# Patient Record
Sex: Male | Born: 1960 | Race: White | Hispanic: No | Marital: Married | Smoking: Never smoker
Health system: Southern US, Community
[De-identification: ages and names within clinical notes are randomized; demographics above are authoritative.]

## PROBLEM LIST (undated history)

## (undated) DIAGNOSIS — I82409 Acute embolism and thrombosis of unspecified deep veins of unspecified lower extremity: Secondary | ICD-10-CM

## (undated) DIAGNOSIS — I2699 Other pulmonary embolism without acute cor pulmonale: Secondary | ICD-10-CM

## (undated) DIAGNOSIS — D6851 Activated protein C resistance: Secondary | ICD-10-CM

## (undated) DIAGNOSIS — E119 Type 2 diabetes mellitus without complications: Secondary | ICD-10-CM

## (undated) HISTORY — PX: HERNIA REPAIR: SHX51

## (undated) HISTORY — PX: FINGER AMPUTATION: SHX636

---

## 2006-03-01 ENCOUNTER — Ambulatory Visit: Payer: Self-pay | Admitting: Family Medicine

## 2007-04-17 ENCOUNTER — Ambulatory Visit (HOSPITAL_BASED_OUTPATIENT_CLINIC_OR_DEPARTMENT_OTHER): Admission: RE | Admit: 2007-04-17 | Discharge: 2007-04-17 | Payer: Self-pay | Admitting: Urology

## 2007-06-26 ENCOUNTER — Ambulatory Visit (HOSPITAL_BASED_OUTPATIENT_CLINIC_OR_DEPARTMENT_OTHER): Admission: RE | Admit: 2007-06-26 | Discharge: 2007-06-26 | Payer: Self-pay | Admitting: Orthopaedic Surgery

## 2007-07-27 ENCOUNTER — Encounter: Admission: RE | Admit: 2007-07-27 | Discharge: 2007-07-27 | Payer: Self-pay | Admitting: Orthopaedic Surgery

## 2008-01-20 IMAGING — RF DG DISKOGRAPHY LUMBAR S+I
9 series · 9 of 9 positions shown · non-contrast
Comparison: none

CLINICAL DATA: The pain has pain low back extends to the left of midline.  He was referred for diagnostic injections.
LUMBAR DISKOGRAM:
TECHNIQUE: He received 1 gram of IV Ancef prior to the procedure.  3 cc of Ancef was placed in the contrast vial utilized for the procedure.  Right posterolateral approaches were utilized.  A 22 gauge Chiba needle was directed into the L3-4 and L4-5 disks.  A 22 gauge spinal needle was placed in the right L5-S1 disk in a transthecal fashion.
Contrast was injected utilizing pressure manometry.
TECHNIQUE: Multidetector CT imaging of the lumbar spine was performed after intradiskal injection of contrast.  Multiplanar CT image reconstructions were also generated.

[Series 1: discogram · 1 of 1 slices shown (1 of 6)]
[im 1/1]
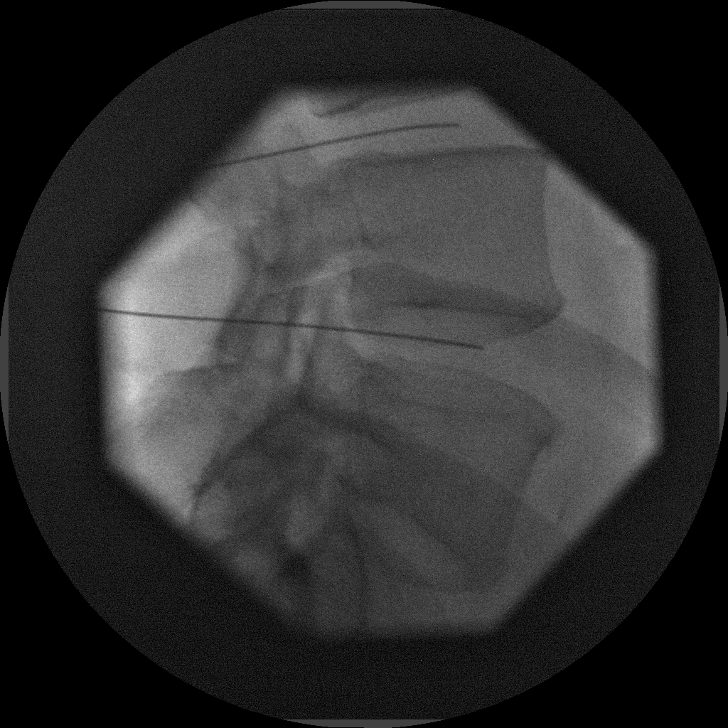

[Series 2: discogram · 1 of 1 slices shown (2 of 6)]
[im 1/1]
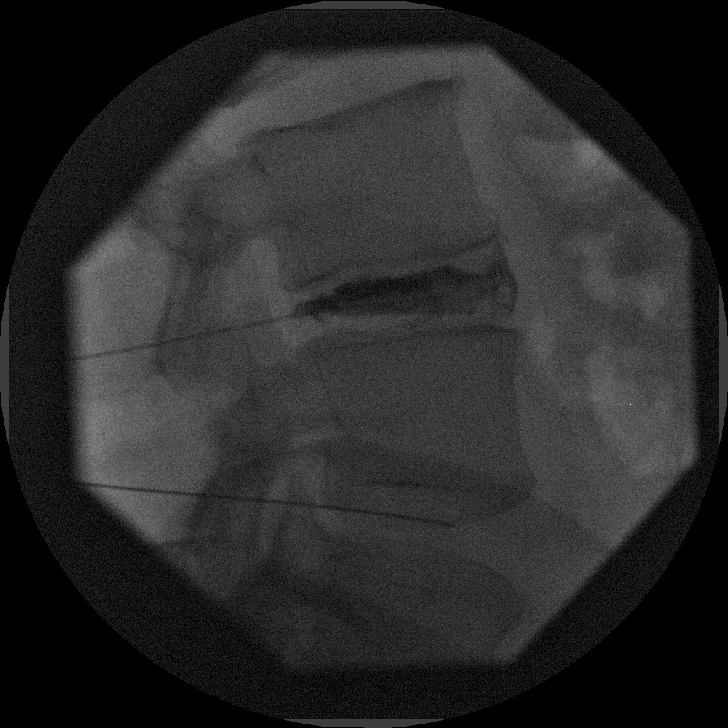

[Series 3: (hospital) · 1 of 1 slices shown (1 of 3)]
[im 1/1]
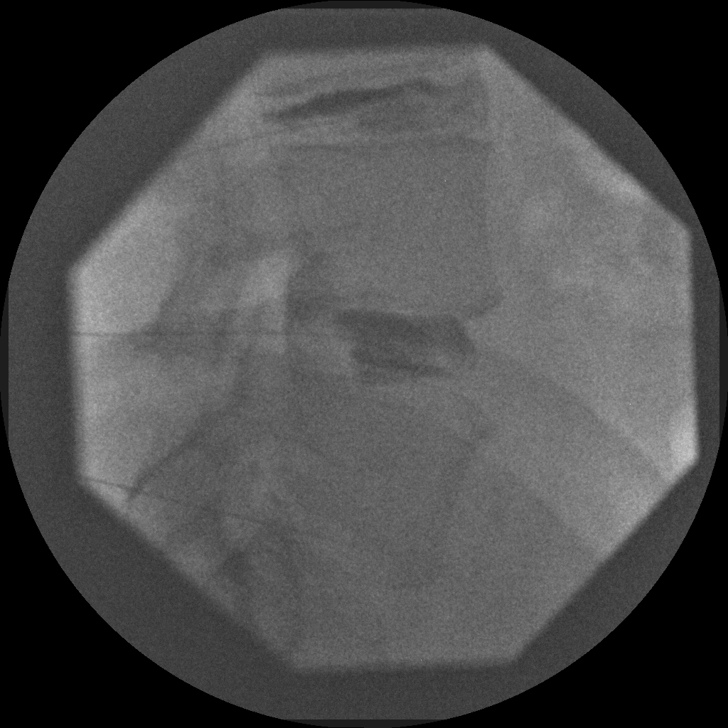

[Series 4: discogram · 1 of 1 slices shown (3 of 6)]
[im 1/1]
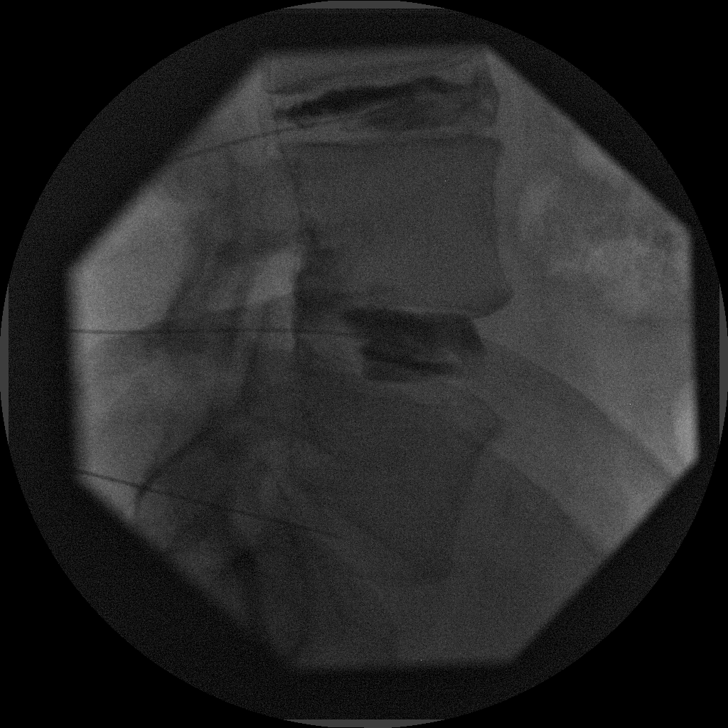

[Series 5: discogram · 1 of 1 slices shown (4 of 6)]
[im 1/1]
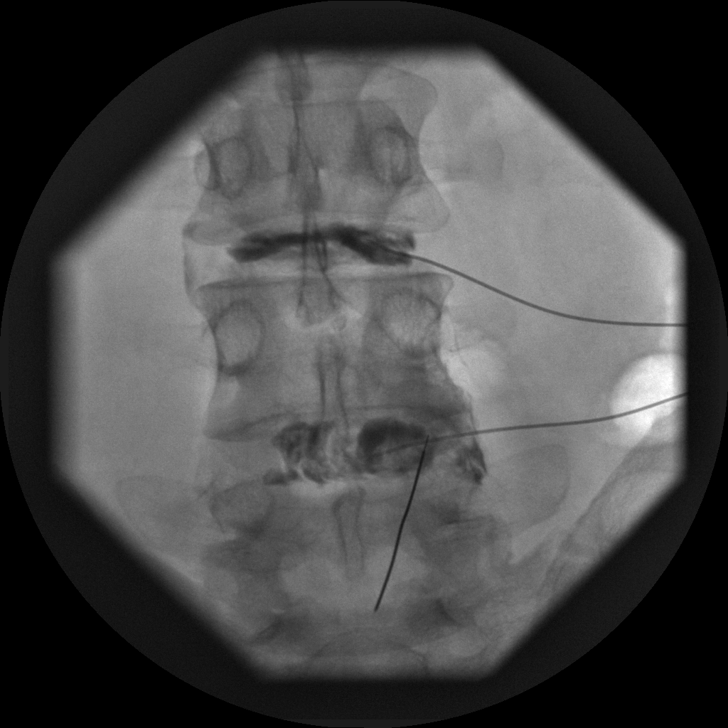

[Series 6: (hospital) · 1 of 1 slices shown (2 of 3)]
[im 1/1]
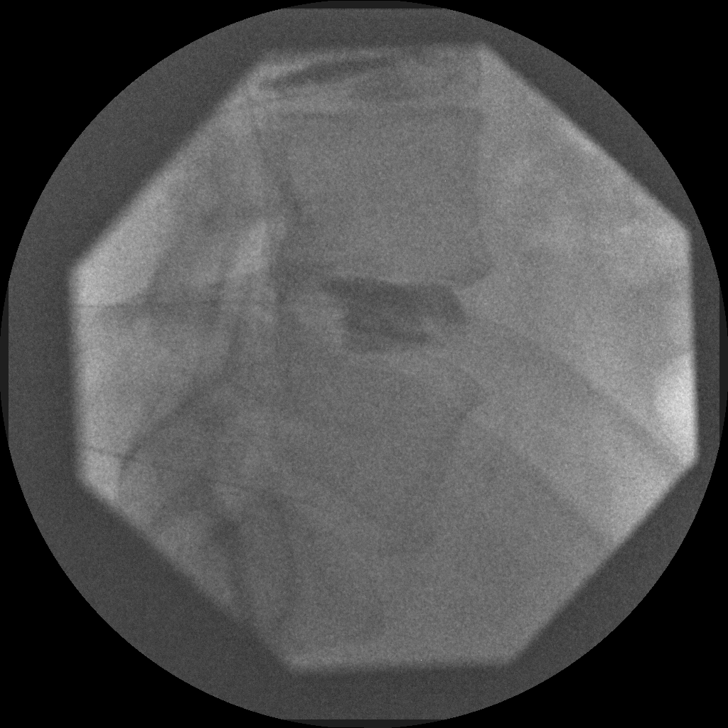

[Series 7: discogram · 1 of 1 slices shown (5 of 6)]
[im 1/1]
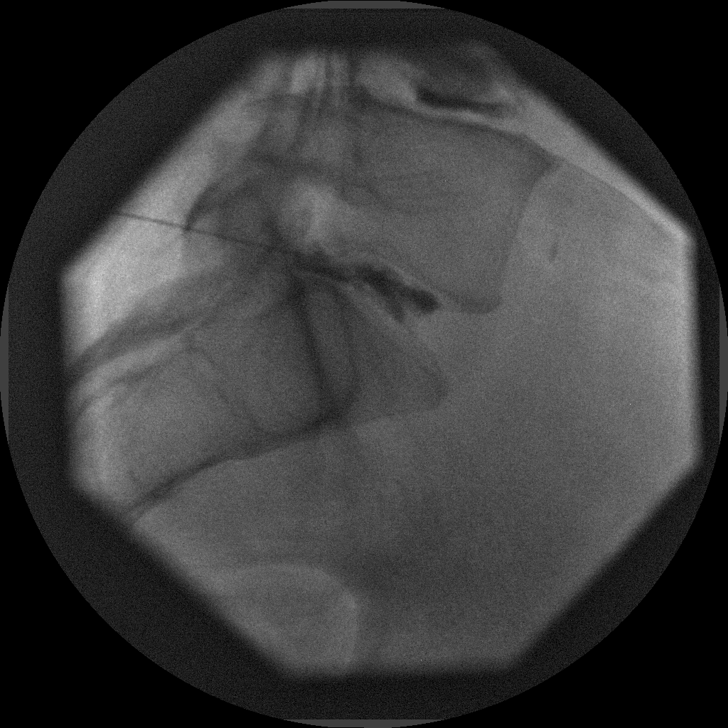

[Series 8: (hospital) · 1 of 1 slices shown (3 of 3)]
[im 1/1]
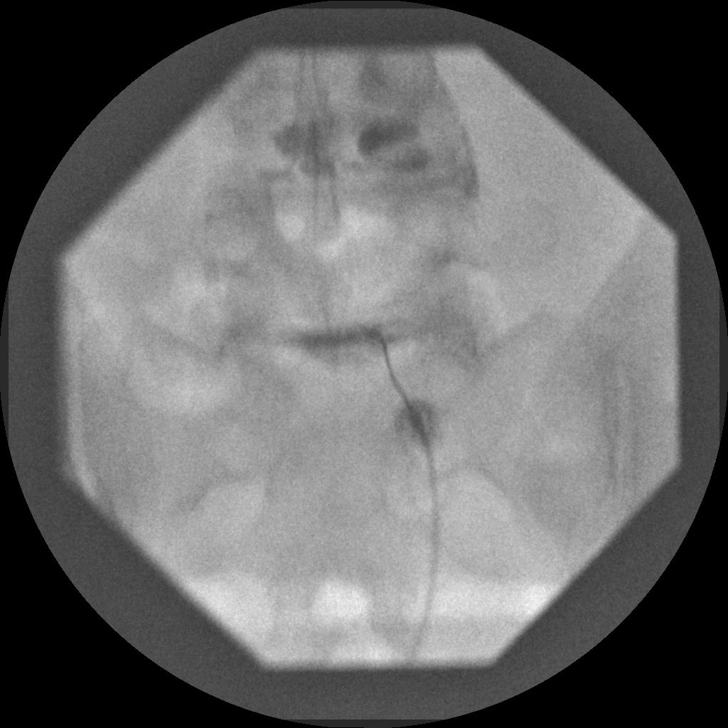

[Series 9: discogram · 1 of 1 slices shown (6 of 6)]
[im 1/1]
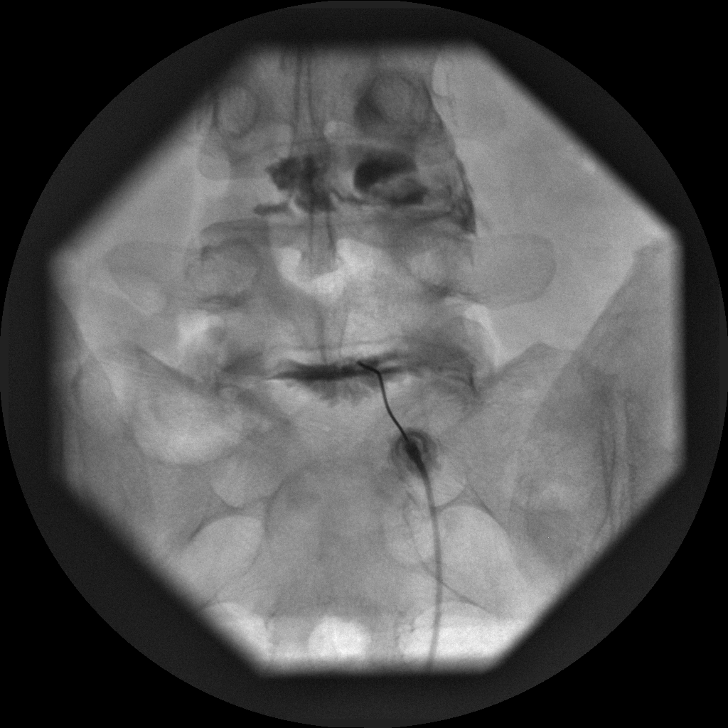

[9 of 9 positions shown; findings below may reference images not displayed]

FINDINGS: L3-4:  Opening pressure 10.  We obtained a pressure of 100 with a soft end point noted.  Contrast collects centrally within the nucleus.  A posterior annular tear is appreciated.  No pain response elicited. 
L4-5:  Opening pressure 15.  At a pressure of approximately 80, we provoke pain.   He states this is very similar to that which he has on a regular basis.  He scores this as [DATE].  An end point was elicited at a pressure of 150.  
L5-S1:  We were unable to perform a paraspinal needle placement into the disk.  A transthecal approach was utilized.  Opening pressure of 5.  At a pressure of approximately 60, we provoke intense pain, he scores as [DATE] and states this is identical to the pain he has on a regular basis.  We obtained a pressure of 100 with a soft end point noted.
IMPRESSION: 1.  Provocative diskography was performed on the L3-4, L4-5, and L5-S1 disk.  Each of the disks are morphology abnormal.  Concordant pain responses were elicited during the injections at L4-5 and L5-S1.  The disks appear to be mechanically sensitive.
2.  At L3-4, an annular tear is noted without a concordant pain response.
POST DISKOGRAM CT SCAN OF THE LUMBAR SPINE:
FINDINGS: L3-4:  Disk bulge eccentric right.  Contrast extends diffusely into the outer annulus.  No central nor foraminal stenosis.
L4-5:  Disk bulge right.  An annular tear on the right extends from the [DATE] to [DATE] positions.  A disk bulge narrows the inferior foramina bilaterally without encroachment on the nerve roots.
L5-S1:  Broad central protrusion with a central annular tear.  The annular tear extends on the left from the 3 o?clock to the 5 o?clock position.   The foramina are patent bilaterally.  Facet degenerative change.
IMPRESSION: Diffuse degenerative changes lumbar spine as described above.

## 2008-01-20 IMAGING — CT CT L SPINE W/ CM
3 of 7 series · 8 of 20 positions shown, 9 images · non-contrast
Comparison: none

CLINICAL DATA: The pain has pain low back extends to the left of midline.  He was referred for diagnostic injections.
LUMBAR DISKOGRAM:
TECHNIQUE: He received 1 gram of IV Ancef prior to the procedure.  3 cc of Ancef was placed in the contrast vial utilized for the procedure.  Right posterolateral approaches were utilized.  A 22 gauge Chiba needle was directed into the L3-4 and L4-5 disks.  A 22 gauge spinal needle was placed in the right L5-S1 disk in a transthecal fashion.
Contrast was injected utilizing pressure manometry.
TECHNIQUE: Multidetector CT imaging of the lumbar spine was performed after intradiskal injection of contrast.  Multiplanar CT image reconstructions were also generated.

[Series 2: l-spine helical · axial · 0.27mm/px · z∈[-87,-7]mm · 4 of 54 slices shown, 5 images]
[im 11/54  soft-tissue]
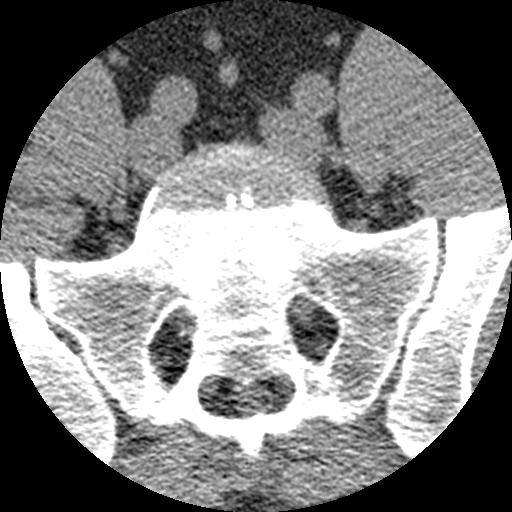
[im 11/54  bone]
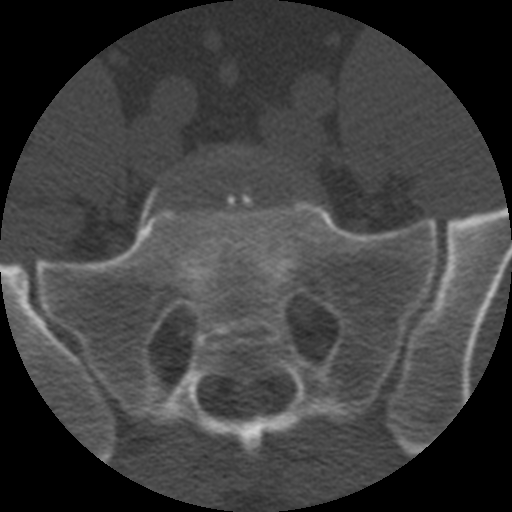
[im 22/54  bone]
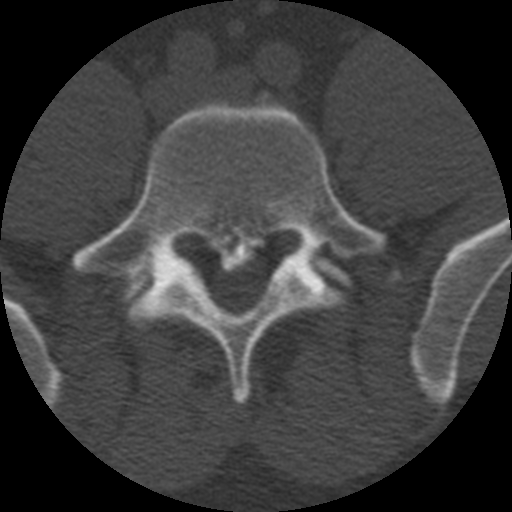
[im 32/54  bone]
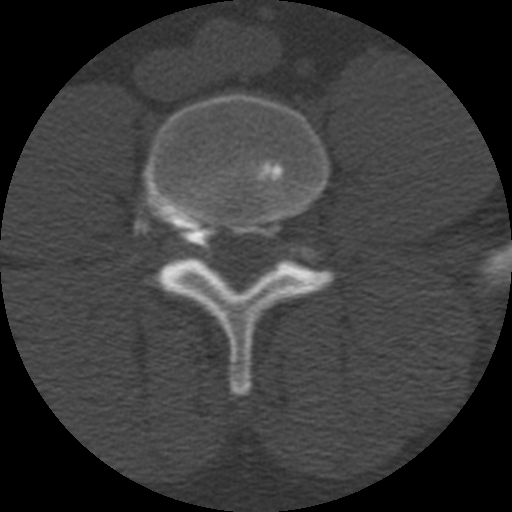
[im 43/54  bone]
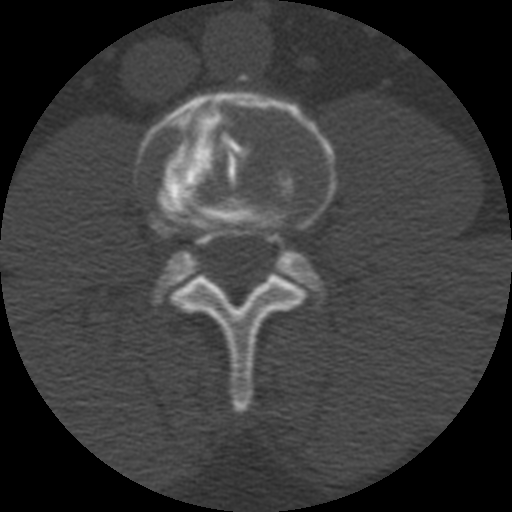

[Series 3: bone windows · axial · 0.27mm/px · z∈[-80,-15]mm · 3 of 54 slices shown]
[im 14/54  bone]
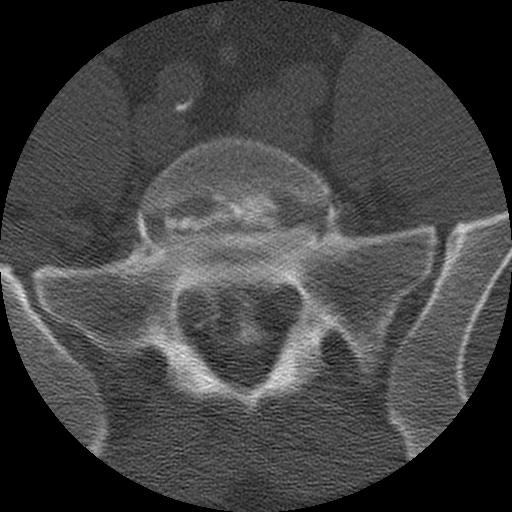
[im 27/54  bone]
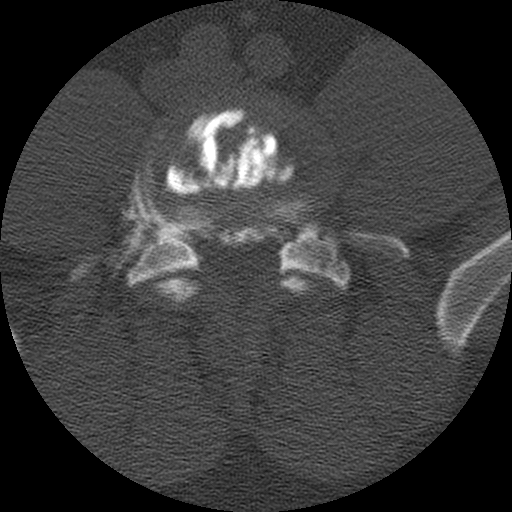
[im 40/54  bone]
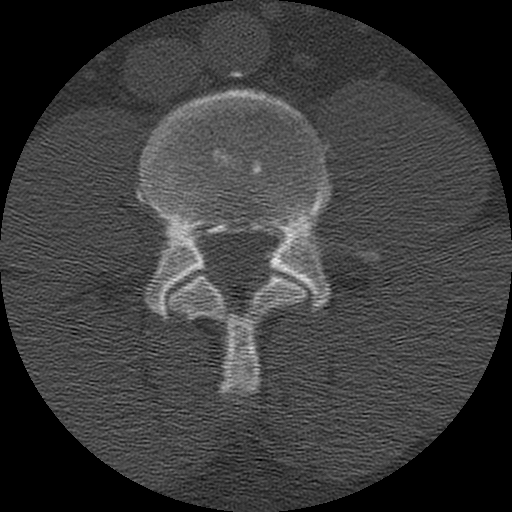

[Series 400: sagittal · sagittal · 0.27mm/px · 1 of 40 slices shown]
[im 20/40  bone]
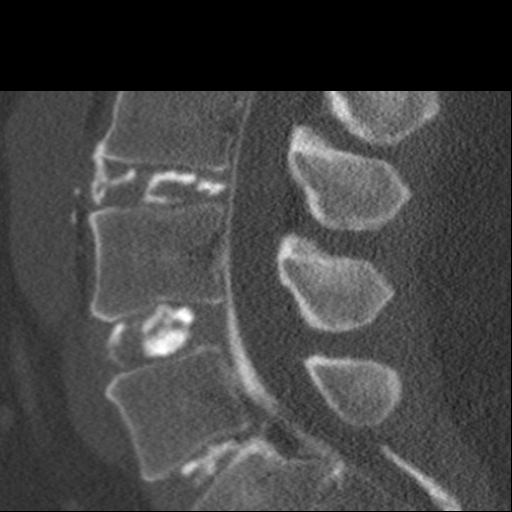

[8 of 20 positions shown; findings below may reference images not displayed]

FINDINGS: L3-4:  Opening pressure 10.  We obtained a pressure of 100 with a soft end point noted.  Contrast collects centrally within the nucleus.  A posterior annular tear is appreciated.  No pain response elicited. 
L4-5:  Opening pressure 15.  At a pressure of approximately 80, we provoke pain.   He states this is very similar to that which he has on a regular basis.  He scores this as [DATE].  An end point was elicited at a pressure of 150.  
L5-S1:  We were unable to perform a paraspinal needle placement into the disk.  A transthecal approach was utilized.  Opening pressure of 5.  At a pressure of approximately 60, we provoke intense pain, he scores as [DATE] and states this is identical to the pain he has on a regular basis.  We obtained a pressure of 100 with a soft end point noted.
IMPRESSION: 1.  Provocative diskography was performed on the L3-4, L4-5, and L5-S1 disk.  Each of the disks are morphology abnormal.  Concordant pain responses were elicited during the injections at L4-5 and L5-S1.  The disks appear to be mechanically sensitive.
2.  At L3-4, an annular tear is noted without a concordant pain response.
POST DISKOGRAM CT SCAN OF THE LUMBAR SPINE:
FINDINGS: L3-4:  Disk bulge eccentric right.  Contrast extends diffusely into the outer annulus.  No central nor foraminal stenosis.
L4-5:  Disk bulge right.  An annular tear on the right extends from the [DATE] to [DATE] positions.  A disk bulge narrows the inferior foramina bilaterally without encroachment on the nerve roots.
L5-S1:  Broad central protrusion with a central annular tear.  The annular tear extends on the left from the 3 o?clock to the 5 o?clock position.   The foramina are patent bilaterally.  Facet degenerative change.
IMPRESSION: Diffuse degenerative changes lumbar spine as described above.

## 2011-04-19 NOTE — Op Note (Signed)
Brandon Davidson, Brandon Davidson NO.:  [id number]   MEDICAL RECORD NO.:  [id number]          PATIENT TYPE:  AMB   LOCATION:  DSC                          FACILITY:  [facility]   PHYSICIAN:  Sharolyn Douglas, M.D.        DATE OF BIRTH:  08/05/1961   DATE OF PROCEDURE:  06/26/2007  DATE OF DISCHARGE:                               OPERATIVE REPORT   PREOPERATIVE DIAGNOSIS:  Lumbar spondylosis, degenerative disk disease,  back and right lower extremity pain.   POSTOPERATIVE DIAGNOSIS:  Lumbar spondylosis, degenerative disk disease,  back and right lower extremity pain.   PROCEDURE:  1. Bilateral L5-S1 facet joint injections  2. Right L5-S1 transforaminal epidural steroid injection.  3. Fluoroscopic imaging used for needle placement of the above      injections.   SURGEON:  Sharolyn Douglas, M.D.   ASSISTANT:  None.   ANESTHESIA:  MAC plus local.   COMPLICATIONS:  None.   INDICATIONS:  The patient is a 50 year old male with persistent back and  right lower extremity pain thought to be secondary to lumbar spondylosis  and degenerative disk disease at L5-S1.  He now presents for the above  injections for diagnostic and therapeutic purposes.  Risks, benefits,  alternatives were reviewed.   PROCEDURE:  After informed consent, taken to the operating room.  He was  turned prone.  His back was prepped and draped in the usual sterile  fashion.  Sedation was given by anesthesia.  The fluoroscopy was brought  into the field and the L5-S1 facet joints were visualized and  22-gauge  Quincke spinal needles were advanced into the facet joints bilaterally  using the fluoroscopy.  Aspiration showed no blood.  Solution of 40 mg  Depo-Medrol and 1 mL preservative-free 1% lidocaine injected into each  facet joint.   We then turned our attention to performing the right L5-S1  transforaminal epidural steroid injection using oblique imaging.  A 22-  gauge 5-inch Quincke spinal needle was advanced in a  posterior oblique  fashion underneath the right L5 pedicle.  Aspiration showed no blood or  CSF.  1 mL of Omnipaque injected which showed epidural spread without  any intravascular uptake.  We then injected a solution of 80 mg Depo-  Medrol and 3 mL 1% preservative-  free lidocaine.  The patient tolerated the procedure well.  No apparent  complications.  He was transferred to recovery in stable condition,  neurologically intact.  I reviewed his post injection instructions with  him.  Follow-up in 2-3 weeks.      Sharolyn Douglas, M.D.  Electronically Signed     MC/MEDQ  D:  06/26/2007  T:  06/26/2007  Job:  [id number]

## 2011-04-22 NOTE — Op Note (Signed)
NAMEMARGARITO, Brandon Davidson              ACCOUNT NO.:  [id number]   MEDICAL RECORD NO.:  [id number]          PATIENT TYPE:  AMB   LOCATION:  DSC                          FACILITY:  [facility]   PHYSICIAN:  Sharolyn Douglas, M.D.        DATE OF BIRTH:  [birth year]   DATE OF PROCEDURE:  04/17/2007  DATE OF DISCHARGE:                               OPERATIVE REPORT   DIAGNOSIS:  Lumbar spondylosis and back pain.   PROCEDURES:  1. Bilateral L5-S1 facette joint injections  2. Fluoroscopic imaging used for needle placement of the above      injection.   SURGEON:  Sharolyn Douglas, MD.   ASSISTANT:  None.   ANESTHESIA:  MAC plus local.   COMPLICATIONS:  None.   INDICATIONS:  The patient is a pleasant 50 year old male with persistent  back pain thought to be secondary to lumbar spondylosis at L5-S1.  Now  presents for diagnostic, potentially therapeutic injections.  Risk,  benefits, alternatives were reviewed.   PROCEDURE:  After informed consent, taken to the operating room, he was  turned prone.  He underwent sedation by anesthesia.  Back prepped,  draped in usual sterile fashion.  Fluoroscopy brought into the field and  imaging of the facette joints at L5-S1 was obtained.  A 22 gauge Quincke  spinal needle was advanced into the facette joints bilaterally.  Aspiration showed no blood.  Injection of 40 mg Depo-Medrol and 1 mL  preservative-free 1% lidocaine injected into each facette joint  bilaterally.  The patient tolerated the procedure well, transferred to  recovery in stable condition.  I reviewed post injection instructions  with him.  Follow-up in 2-3 weeks.      Sharolyn Douglas, M.D.  Electronically Signed     MC/MEDQ  D:  04/17/2007  T:  04/17/2007  Job:  [id number]

## 2015-02-11 DIAGNOSIS — H5210 Myopia, unspecified eye: Secondary | ICD-10-CM | POA: Diagnosis not present

## 2015-02-11 DIAGNOSIS — H5203 Hypermetropia, bilateral: Secondary | ICD-10-CM | POA: Diagnosis not present

## 2017-08-11 DIAGNOSIS — D518 Other vitamin B12 deficiency anemias: Secondary | ICD-10-CM | POA: Diagnosis not present

## 2017-08-11 DIAGNOSIS — E782 Mixed hyperlipidemia: Secondary | ICD-10-CM | POA: Diagnosis not present

## 2017-08-11 DIAGNOSIS — R011 Cardiac murmur, unspecified: Secondary | ICD-10-CM | POA: Diagnosis not present

## 2017-08-11 DIAGNOSIS — Z1211 Encounter for screening for malignant neoplasm of colon: Secondary | ICD-10-CM | POA: Diagnosis not present

## 2017-08-11 DIAGNOSIS — E038 Other specified hypothyroidism: Secondary | ICD-10-CM | POA: Diagnosis not present

## 2017-08-11 DIAGNOSIS — Z79899 Other long term (current) drug therapy: Secondary | ICD-10-CM | POA: Diagnosis not present

## 2017-08-11 DIAGNOSIS — E559 Vitamin D deficiency, unspecified: Secondary | ICD-10-CM | POA: Diagnosis not present

## 2017-08-11 DIAGNOSIS — M545 Low back pain: Secondary | ICD-10-CM | POA: Diagnosis not present

## 2017-08-11 DIAGNOSIS — E291 Testicular hypofunction: Secondary | ICD-10-CM | POA: Diagnosis not present

## 2017-08-11 DIAGNOSIS — I10 Essential (primary) hypertension: Secondary | ICD-10-CM | POA: Diagnosis not present

## 2017-08-11 DIAGNOSIS — N401 Enlarged prostate with lower urinary tract symptoms: Secondary | ICD-10-CM | POA: Diagnosis not present

## 2017-08-14 DIAGNOSIS — N400 Enlarged prostate without lower urinary tract symptoms: Secondary | ICD-10-CM | POA: Diagnosis not present

## 2017-08-14 DIAGNOSIS — N509 Disorder of male genital organs, unspecified: Secondary | ICD-10-CM | POA: Diagnosis not present

## 2017-08-16 DIAGNOSIS — E782 Mixed hyperlipidemia: Secondary | ICD-10-CM | POA: Diagnosis not present

## 2017-08-16 DIAGNOSIS — Z79899 Other long term (current) drug therapy: Secondary | ICD-10-CM | POA: Diagnosis not present

## 2017-08-25 DIAGNOSIS — E782 Mixed hyperlipidemia: Secondary | ICD-10-CM | POA: Diagnosis not present

## 2017-08-25 DIAGNOSIS — E1165 Type 2 diabetes mellitus with hyperglycemia: Secondary | ICD-10-CM | POA: Diagnosis not present

## 2017-08-25 DIAGNOSIS — E038 Other specified hypothyroidism: Secondary | ICD-10-CM | POA: Diagnosis not present

## 2017-08-30 DIAGNOSIS — E038 Other specified hypothyroidism: Secondary | ICD-10-CM | POA: Diagnosis not present

## 2017-08-30 DIAGNOSIS — E1165 Type 2 diabetes mellitus with hyperglycemia: Secondary | ICD-10-CM | POA: Diagnosis not present

## 2017-08-30 DIAGNOSIS — N401 Enlarged prostate with lower urinary tract symptoms: Secondary | ICD-10-CM | POA: Diagnosis not present

## 2017-08-30 DIAGNOSIS — E782 Mixed hyperlipidemia: Secondary | ICD-10-CM | POA: Diagnosis not present

## 2019-05-22 ENCOUNTER — Encounter: Payer: Self-pay | Admitting: Internal Medicine

## 2023-04-21 ENCOUNTER — Emergency Department (HOSPITAL_COMMUNITY): Payer: Medicare HMO

## 2023-04-21 ENCOUNTER — Emergency Department (HOSPITAL_COMMUNITY)
Admission: EM | Admit: 2023-04-21 | Discharge: 2023-04-21 | Disposition: A | Discharge status: Home / Self Care | Payer: Medicare HMO | Attending: Emergency Medicine | Admitting: Emergency Medicine

## 2023-04-21 ENCOUNTER — Other Ambulatory Visit: Payer: Self-pay

## 2023-04-21 ENCOUNTER — Encounter (HOSPITAL_COMMUNITY): Payer: Self-pay

## 2023-04-21 ENCOUNTER — Emergency Department (HOSPITAL_BASED_OUTPATIENT_CLINIC_OR_DEPARTMENT_OTHER): Payer: Medicare HMO

## 2023-04-21 DIAGNOSIS — R0602 Shortness of breath: Secondary | ICD-10-CM | POA: Insufficient documentation

## 2023-04-21 DIAGNOSIS — M7989 Other specified soft tissue disorders: Secondary | ICD-10-CM | POA: Diagnosis not present

## 2023-04-21 DIAGNOSIS — R000 Tachycardia, unspecified: Secondary | ICD-10-CM | POA: Diagnosis not present

## 2023-04-21 DIAGNOSIS — Z7901 Long term (current) use of anticoagulants: Secondary | ICD-10-CM | POA: Insufficient documentation

## 2023-04-21 DIAGNOSIS — I82432 Acute embolism and thrombosis of left popliteal vein: Secondary | ICD-10-CM | POA: Insufficient documentation

## 2023-04-21 DIAGNOSIS — E1165 Type 2 diabetes mellitus with hyperglycemia: Secondary | ICD-10-CM | POA: Diagnosis not present

## 2023-04-21 DIAGNOSIS — I82412 Acute embolism and thrombosis of left femoral vein: Secondary | ICD-10-CM

## 2023-04-21 DIAGNOSIS — R2243 Localized swelling, mass and lump, lower limb, bilateral: Secondary | ICD-10-CM | POA: Diagnosis present

## 2023-04-21 HISTORY — DX: Acute embolism and thrombosis of unspecified deep veins of unspecified lower extremity: I82.409

## 2023-04-21 HISTORY — DX: Other pulmonary embolism without acute cor pulmonale: I26.99

## 2023-04-21 HISTORY — DX: Type 2 diabetes mellitus without complications: E11.9

## 2023-04-21 HISTORY — DX: Activated protein C resistance: D68.51

## 2023-04-21 LAB — CBC WITH DIFFERENTIAL/PLATELET
Abs Immature Granulocytes: 0.01 10*3/uL (ref 0.00–0.07)
Basophils Absolute: 0 10*3/uL (ref 0.0–0.1)
Basophils Relative: 1 %
Eosinophils Absolute: 0.1 10*3/uL (ref 0.0–0.5)
Eosinophils Relative: 2 %
HCT: 38.1 % — ABNORMAL LOW (ref 39.0–52.0)
Hemoglobin: 12.3 g/dL — ABNORMAL LOW (ref 13.0–17.0)
Immature Granulocytes: 0 %
Lymphocytes Relative: 16 %
Lymphs Abs: 0.6 10*3/uL — ABNORMAL LOW (ref 0.7–4.0)
MCH: 28.5 pg (ref 26.0–34.0)
MCHC: 32.3 g/dL (ref 30.0–36.0)
MCV: 88.2 fL (ref 80.0–100.0)
Monocytes Absolute: 0.5 10*3/uL (ref 0.1–1.0)
Monocytes Relative: 11 %
Neutro Abs: 2.9 10*3/uL (ref 1.7–7.7)
Neutrophils Relative %: 70 %
Platelets: 227 10*3/uL (ref 150–400)
RBC: 4.32 MIL/uL (ref 4.22–5.81)
RDW: 12.1 % (ref 11.5–15.5)
WBC: 4.1 10*3/uL (ref 4.0–10.5)
nRBC: 0 % (ref 0.0–0.2)

## 2023-04-21 LAB — BASIC METABOLIC PANEL
Anion gap: 9 (ref 5–15)
BUN: 13 mg/dL (ref 6–20)
CO2: 28 mmol/L (ref 22–32)
Calcium: 8.6 mg/dL — ABNORMAL LOW (ref 8.9–10.3)
Chloride: 100 mmol/L (ref 98–111)
Creatinine, Ser: 1.04 mg/dL (ref 0.61–1.24)
GFR, Estimated: >60 mL/min (ref >60)
Glucose, Bld: 242 mg/dL — ABNORMAL HIGH (ref 70–99)
Potassium: 4.2 mmol/L (ref 3.5–5.1)
Sodium: 137 mmol/L (ref 135–145)

## 2023-04-21 LAB — BRAIN NATRIURETIC PEPTIDE: B Natriuretic Peptide: 25.3 pg/mL (ref 0.0–100.0)

## 2023-04-21 LAB — TROPONIN I (HIGH SENSITIVITY): Troponin I (High Sensitivity): 8 ng/L (ref <18)

## 2023-04-21 MED ORDER — OXYCODONE-ACETAMINOPHEN 5-325 MG PO TABS
1.0000 | ORAL_TABLET | Freq: Once | ORAL | Status: AC
  Administered 2023-04-21: 1 via ORAL
  Filled 2023-04-21: qty 1

## 2023-04-21 MED ORDER — SODIUM CHLORIDE (PF) 0.9 % IJ SOLN
INTRAMUSCULAR | Status: AC
  Filled 2023-04-21: qty 50

## 2023-04-21 MED ORDER — IOHEXOL 350 MG/ML SOLN
80.0000 mL | Freq: Once | INTRAVENOUS | Status: AC | PRN
  Administered 2023-04-21: 80 mL via INTRAVENOUS

## 2023-04-21 NOTE — ED Provider Notes (Signed)
[place] [facility] AT [facility] Provider Note   CSN: [id number] Arrival date & time: 04/21/23  0245     History  Chief Complaint  Patient presents with   Shortness of Breath   Leg Swelling    Brandon Davidson is a 62 y.o. male.  62 year old male with history of factor V Leiden, PE (on Eliquis, may have missed dose, with filter placed 01/03/23), DVT, DM, presents with his cousin for [facility] x 2 weeks. States symptoms are worse when he bends over and then stands back up.  Diagnoesd with saddle PE in 2023 with admission to [facility] ER 1/14-18/23. Concerned he has another PE. Continues to have L>R lower extremity swelling with known left leg DVT. Occasionally has CP, last pain in chest was Wednesday.        Home Medications Prior to Admission medications   Not on File      Allergies    Patient has no known allergies.    Review of Systems   Review of Systems Negative except as per HPI Physical Exam Updated Vital Signs BP (!) 155/87 (BP Location: Right Arm)   Pulse 90   Temp 98.3 F (36.8 C)   Resp (!) 23   Ht 5\' 7"  (1.702 m)   Wt 97.5 kg   SpO2 98%   BMI 33.67 kg/m  Physical Exam Vitals and nursing note reviewed.  Constitutional:      General: He is not in acute distress.    Appearance: He is well-developed. He is not diaphoretic.  HENT:     Head: Normocephalic and atraumatic.  Cardiovascular:     Rate and Rhythm: Regular rhythm. Tachycardia present.  Pulmonary:     Effort: Pulmonary effort is normal.     Breath sounds: Normal breath sounds. No decreased breath sounds.  Chest:     Chest wall: No tenderness.  Abdominal:     Palpations: Abdomen is soft.     Tenderness: There is no abdominal tenderness.  Musculoskeletal:     Right lower leg: Edema present.     Left lower leg: Edema present.  Skin:    General: Skin is warm and dry.  Neurological:     Mental Status: He is alert and oriented to person, place, and time.  Psychiatric:         Behavior: Behavior normal.     ED Results / Procedures / Treatments   Labs (all labs ordered are listed, but only abnormal results are displayed) Labs Reviewed  BASIC METABOLIC PANEL - Abnormal; Notable for the following components:      Result Value   Glucose, Bld 242 (*)    Calcium 8.6 (*)    All other components within normal limits  CBC WITH DIFFERENTIAL/PLATELET - Abnormal; Notable for the following components:   Hemoglobin 12.3 (*)    HCT 38.1 (*)    Lymphs Abs 0.6 (*)    All other components within normal limits  BRAIN NATRIURETIC PEPTIDE    EKG None  Radiology CT Angio Chest PE W/Cm &/Or Wo Cm  Result Date: 04/21/2023 CLINICAL DATA:  62 year old male with shortness of breath and nonproductive cough for 2 weeks. History of PE in January,, factor 5 Leiden abnormality, IVC filter placed 01/03/2023. EXAM: CT ANGIOGRAPHY CHEST WITH CONTRAST TECHNIQUE: Multidetector CT imaging of the chest was performed using the standard protocol during bolus administration of intravenous contrast. Multiplanar CT image reconstructions and MIPs were obtained to evaluate the vascular anatomy. RADIATION DOSE  REDUCTION: This exam was performed according to the departmental dose-optimization program which includes automated exposure control, adjustment of the mA and/or kV according to patient size and/or use of iterative reconstruction technique. CONTRAST:  80mL OMNIPAQUE IOHEXOL 350 MG/ML SOLN COMPARISON:  CTA chest 12/20/2022 FINDINGS: Cardiovascular: Good contrast bolus timing in the pulmonary arterial tree. Bilateral main pulmonary artery thrombus has substantially regressed since the January CTA. There is residual linear chronic PE at both hila (series 11 Images 132 and 146 on the left, image 150 on the right). And other linear and web-like chronic PE can be identified (right lower lobe series 11, image 158). But no convincing acute or occlusive pulmonary artery clot. Stable cardiac size at the upper  limits of normal. Mild aortic atherosclerosis. Negative for thoracic aortic aneurysm or dissection. Mediastinum/Nodes: Stable. No mediastinal mass or lymphadenopathy. Nonspecific bilateral hilar lymphadenopathy is stable and might be reactive, inflammatory. Lungs/Pleura: Stable lung volumes and ventilation. 4 mm left lung nodule series 13, image 67 now appears sub solid, partially faded suggesting benign etiology - see the detailed follow up recommendations on 12/20/2022. No pleural effusion or acute lung opacity. Upper Abdomen: Negative visible liver, gallbladder (contracted), spleen, pancreas, adrenal glands, kidneys, and bowel in the upper abdomen. The IVC filter is not included on the CT images but is visible on the scout view. Musculoskeletal: Stable. 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 Review of the MIP images confirms the above findings. IMPRESSION: 1. Substantial regression of bilateral pulmonary artery thrombus since January. 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 No acute or occlusive pulmonary embolus identified. 2. No new abnormality in the Chest. 3.  Aortic Atherosclerosis (ICD10-I70.0). Electronically Signed   By: Odessa Fleming M.D.   On: 04/21/2023 06:18   DG Chest Port 1 View  Result Date: 04/21/2023 CLINICAL DATA:  Shortness of breath EXAM: PORTABLE CHEST 1 VIEW COMPARISON:  12/19/2021 FINDINGS: The heart size and mediastinal contours are within normal limits. Both lungs are clear. The visualized skeletal structures are unremarkable. IMPRESSION: Normal study. Electronically Signed   By: Charlett Nose M.D.   On: 04/21/2023 03:43    Procedures Procedures    Medications Ordered in ED Medications  oxyCODONE-acetaminophen (PERCOCET/ROXICET) 5-325 MG per tablet 1 tablet (1 tablet Oral Given 04/21/23 0418)  iohexol (OMNIPAQUE) 350 MG/ML injection 80 mL (80 mLs Intravenous Contrast Given 04/21/23 0513)    ED Course/ Medical Decision Making/ A&P                              Medical Decision Making Amount and/or Complexity of Data Reviewed Labs: ordered. Radiology: ordered.  Risk Prescription drug management.   This patient presents to the ED for concern of shortness of breath, leg swelling, this involves an extensive number of treatment options, and is a complaint that carries with it a high risk of complications and morbidity.  The differential diagnosis includes but not mended to PE, DVT, CHF   Co morbidities that complicate the patient evaluation  Factor V Leiden, PE, DVT, diabetes   Additional history obtained:  Additional history obtained from family at bedside who contributes to history as above External records from outside source obtained and reviewed including prior records including recent follow-up with hematology   Lab Tests:  I Ordered, and personally interpreted labs.  The pertinent results include: BMP with elevated glucose at 242, patient is diabetic, nonfasting.  CBC without significant findings.  BNP normal.  Imaging Studies ordered:  I ordered imaging studies including chest x-ray, CT PE study I independently visualized and interpreted imaging which showed chest x-ray without acute findings.  See radiology report regarding improvement on patient's known PEs I agree with the radiologist interpretation   Cardiac Monitoring: / EKG:  The patient was maintained on a cardiac monitor.  I personally viewed and interpreted the cardiac monitored which showed an underlying rhythm of: Sinus rhythm, rate 95   Problem List / ED Course / Critical interventions / Medication management  62 year old male with history of factor V Leiden, PE and DVT with IVC filter in place on Eliquis presents with complaint of shortness of breath for the past 2 weeks.  Initially reported his left lower swelling to be at baseline however now states seems worse than usual and is concerned for worsening or new PE.  Labs reviewed and  reassuring.  CTA of the chest is negative for PE.  Patient is signed out at change of shift pending left lower extremity DVT ultrasound. I ordered medication including Percocet for leg pain Reevaluation of the patient after these medicines showed that the patient stayed the same I have reviewed the patients home medicines and have made adjustments as needed   Social Determinants of Health:  Lives with family   Test / Admission - Considered:  Disposition pending at time of shift change         Final Clinical Impression(s) / ED Diagnoses Final diagnoses:  None    Rx / DC Orders ED Discharge Orders     None         Jeannie Fend, PA-C 04/21/23 0649    Nira Conn, MD 04/21/23 [phone]

## 2023-04-21 NOTE — ED Notes (Signed)
Pt back from CT

## 2023-04-21 NOTE — ED Notes (Signed)
Pt is sitting up in chair due to discomfort laying in the bed. Pt has leg propped up on bed.

## 2023-04-21 NOTE — ED Provider Notes (Signed)
Received handover from L. Eulah Pont PA. Hx of Pes w/IVC filter, factor V, on Eliquis, missed a few doses. C/o SOB, improving CTA.   C/o enlarged LLE. Pending DVT study.   Physical Exam  BP (!) 155/87 (BP Location: Right Arm)   Pulse 90   Temp 98.3 F (36.8 C)   Resp (!) 23   Ht 5\' 7"  (1.702 m)   Wt 97.5 kg   SpO2 98%   BMI 33.67 kg/m   Physical Exam Vitals and nursing note reviewed.  Constitutional:      General: He is not in acute distress.    Appearance: He is well-developed.  HENT:     Head: Normocephalic and atraumatic.  Eyes:     Conjunctiva/sclera: Conjunctivae normal.  Cardiovascular:     Rate and Rhythm: Normal rate and regular rhythm.     Heart sounds: No murmur heard. Pulmonary:     Effort: Pulmonary effort is normal. No respiratory distress.     Breath sounds: Normal breath sounds.  Abdominal:     Palpations: Abdomen is soft.     Tenderness: There is no abdominal tenderness.  Musculoskeletal:        General: No swelling.     Cervical back: Neck supple.  Skin:    General: Skin is warm and dry.     Capillary Refill: Capillary refill takes less than 2 seconds.  Neurological:     Mental Status: He is alert.  Psychiatric:        Mood and Affect: Mood normal.     Procedures  Procedures  ED Course / MDM    Medical Decision Making Patient is a 62 year old here pending DVT study.  DVT study showed DVT of indeterminate age and left femoral, and left popliteal.  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  I attempted to get hold of his primary care doctor, and discussed with nursing, and they state they have no evidence of a DVT there.  But do not see any recent kind of imaging.  Patient is adamant that he received ultrasound there, and has no DVT there.  I discussed with vascular on-call Dr. Sherral Hammers, he recommends follow-up in the DVT clinic, patient was noncompliant with Eliquis  in the several doses, this may have resulted in the DVT, thus no new change in management.  Referred to DVT clinic, with return precautions emphasized.  Amount and/or Complexity of Data Reviewed Labs: ordered. Radiology: ordered.  Risk Prescription drug management.         Pete Pelt, [place] 04/21/23 1546    Sloan Leiter, DO 04/22/23 [id number]

## 2023-04-21 NOTE — ED Triage Notes (Signed)
Pt arrives c/o SOB and dry cough for nearly 2 weeks. States that bending over/standing up and exerting himself causes him to get more SOB. Pt also c/o L lower leg swelling x2 months. Hx of saddle PE, Factor V Leiden on Eliquis, DVT to L leg a few months ago. Has been taking eliquis as prescribed, but may have missed night's dose. Last dose likely taken on 04/20/23 around 0430.

## 2023-04-21 NOTE — ED Notes (Signed)
Patient transported to CT 

## 2023-04-21 NOTE — Progress Notes (Signed)
LLE venous duplex has been completed.  Preliminary results given to [employer], PA-C.   Results can be found under chart review under CV PROC. 04/21/2023 10:11 AM Brodyn Depuy RVT, RDMS

## 2023-04-21 NOTE — Discharge Instructions (Addendum)
Please follow-up with the DVT clinic. Continue taking your eliquis. Return to the ED if you lose feeling in your foot, or it becomes cool or blue.
